# Patient Record
Sex: Male | Born: 1965 | Race: Black or African American | Hispanic: No | Marital: Married
Health system: Southern US, Community
[De-identification: ages and names within clinical notes are randomized; demographics above are authoritative.]

---

## 2005-10-14 ENCOUNTER — Emergency Department: Payer: Self-pay | Admitting: Emergency Medicine

## 2006-04-19 ENCOUNTER — Emergency Department: Payer: Self-pay | Admitting: Emergency Medicine

## 2006-04-21 ENCOUNTER — Emergency Department: Payer: Self-pay | Admitting: Emergency Medicine

## 2006-09-14 ENCOUNTER — Emergency Department (HOSPITAL_COMMUNITY): Admission: EM | Admit: 2006-09-14 | Discharge: 2006-09-14 | Payer: Self-pay | Admitting: Emergency Medicine

## 2007-04-15 ENCOUNTER — Emergency Department: Payer: Self-pay | Admitting: Emergency Medicine

## 2007-11-11 ENCOUNTER — Emergency Department: Payer: Self-pay | Admitting: Unknown Physician Specialty

## 2007-12-15 ENCOUNTER — Other Ambulatory Visit: Payer: Self-pay

## 2007-12-15 ENCOUNTER — Emergency Department: Payer: Self-pay | Admitting: Unknown Physician Specialty

## 2008-02-17 ENCOUNTER — Emergency Department: Payer: Self-pay | Admitting: Emergency Medicine

## 2008-02-17 ENCOUNTER — Other Ambulatory Visit: Payer: Self-pay

## 2008-07-01 ENCOUNTER — Emergency Department: Payer: Self-pay | Admitting: Emergency Medicine

## 2009-06-19 ENCOUNTER — Emergency Department: Payer: Self-pay | Admitting: Emergency Medicine

## 2010-12-19 ENCOUNTER — Emergency Department: Payer: Self-pay | Admitting: Emergency Medicine

## 2010-12-21 ENCOUNTER — Emergency Department: Payer: Self-pay | Admitting: Emergency Medicine

## 2011-03-16 ENCOUNTER — Emergency Department: Payer: Self-pay | Admitting: Emergency Medicine

## 2011-03-19 ENCOUNTER — Emergency Department: Payer: Self-pay | Admitting: Emergency Medicine

## 2011-07-19 ENCOUNTER — Emergency Department: Payer: Self-pay | Admitting: Emergency Medicine

## 2011-08-17 ENCOUNTER — Emergency Department: Payer: Self-pay | Admitting: *Deleted
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
## 2012-02-08 ENCOUNTER — Emergency Department: Payer: Self-pay | Admitting: Emergency Medicine

## 2012-06-05 ENCOUNTER — Emergency Department: Payer: Self-pay | Admitting: Emergency Medicine

## 2014-09-12 ENCOUNTER — Encounter (HOSPITAL_COMMUNITY): Payer: Self-pay | Admitting: Emergency Medicine

## 2014-09-12 ENCOUNTER — Emergency Department (HOSPITAL_COMMUNITY): Payer: Self-pay

## 2014-09-12 ENCOUNTER — Emergency Department (HOSPITAL_COMMUNITY)
Admission: EM | Admit: 2014-09-12 | Discharge: 2014-09-12 | Disposition: A | Discharge status: Home / Self Care | Payer: Self-pay | Attending: Emergency Medicine | Admitting: Emergency Medicine

## 2014-09-12 DIAGNOSIS — S41111A Laceration without foreign body of right upper arm, initial encounter: Secondary | ICD-10-CM

## 2014-09-12 DIAGNOSIS — S41109A Unspecified open wound of unspecified upper arm, initial encounter: Secondary | ICD-10-CM | POA: Insufficient documentation

## 2014-09-12 DIAGNOSIS — Z23 Encounter for immunization: Secondary | ICD-10-CM | POA: Insufficient documentation

## 2014-09-12 DIAGNOSIS — S41122A Laceration with foreign body of left upper arm, initial encounter: Secondary | ICD-10-CM

## 2014-09-12 DIAGNOSIS — T700XXA Otitic barotrauma, initial encounter: Secondary | ICD-10-CM | POA: Insufficient documentation

## 2014-09-12 DIAGNOSIS — Y92410 Unspecified street and highway as the place of occurrence of the external cause: Secondary | ICD-10-CM | POA: Insufficient documentation

## 2014-09-12 DIAGNOSIS — Y9389 Activity, other specified: Secondary | ICD-10-CM | POA: Insufficient documentation

## 2014-09-12 DIAGNOSIS — S61412A Laceration without foreign body of left hand, initial encounter: Secondary | ICD-10-CM

## 2014-09-12 DIAGNOSIS — S51809A Unspecified open wound of unspecified forearm, initial encounter: Secondary | ICD-10-CM | POA: Insufficient documentation

## 2014-09-12 MED ORDER — OXYCODONE-ACETAMINOPHEN 5-325 MG PO TABS
2.0000 | ORAL_TABLET | Freq: Once | ORAL | Status: AC
  Administered 2014-09-12: 2 via ORAL
  Filled 2014-09-12: qty 2

## 2014-09-12 MED ORDER — LIDOCAINE-EPINEPHRINE 1 %-1:100000 IJ SOLN: 30.0000 mL | Freq: Once | INTRAMUSCULAR | Status: DC

## 2014-09-12 MED ORDER — LIDOCAINE HCL 2 % IJ SOLN
10.0000 mL | Freq: Once | INTRAMUSCULAR | Status: AC
  Administered 2014-09-12: 200 mg via INTRADERMAL
  Filled 2014-09-12: qty 20

## 2014-09-12 MED ORDER — HYDROMORPHONE HCL 1 MG/ML IJ SOLN
1.0000 mg | Freq: Once | INTRAMUSCULAR | Status: AC
  Administered 2014-09-12: 1 mg via INTRAMUSCULAR
  Filled 2014-09-12: qty 1

## 2014-09-12 MED ORDER — OXYCODONE-ACETAMINOPHEN 5-325 MG PO TABS: 1.0000 | ORAL_TABLET | ORAL | Status: AC | PRN

## 2014-09-12 MED ORDER — LIDOCAINE-EPINEPHRINE 1 %-1:100000 IJ SOLN
INTRAMUSCULAR | Status: AC
  Filled 2014-09-12: qty 2

## 2014-09-12 MED ORDER — CEPHALEXIN 500 MG PO CAPS: 500.0000 mg | ORAL_CAPSULE | Freq: Four times a day (QID) | ORAL | Status: AC

## 2014-09-12 MED ORDER — CEFAZOLIN (ANCEF) 1 G IV SOLR
2.0000 g | INTRAVENOUS | Status: DC
  Filled 2014-09-12: qty 2

## 2014-09-12 MED ORDER — TETANUS-DIPHTH-ACELL PERTUSSIS 5-2.5-18.5 LF-MCG/0.5 IM SUSP
0.5000 mL | Freq: Once | INTRAMUSCULAR | Status: AC
  Administered 2014-09-12: 0.5 mL via INTRAMUSCULAR
  Filled 2014-09-12: qty 0.5

## 2014-09-12 MED ORDER — HYDROMORPHONE HCL 1 MG/ML IJ SOLN: 1.0000 mg | INTRAMUSCULAR | Status: DC

## 2014-09-12 MED ORDER — CEFAZOLIN SODIUM-DEXTROSE 2-3 GM-% IV SOLR
2.0000 g | Freq: Once | INTRAVENOUS | Status: AC
  Administered 2014-09-12: 2 g via INTRAVENOUS
  Filled 2014-09-12: qty 50

## 2014-09-12 MED ORDER — METHOCARBAMOL 750 MG PO TABS: 750.0000 mg | ORAL_TABLET | Freq: Two times a day (BID) | ORAL | Status: AC

## 2014-09-12 MED ORDER — LIDOCAINE-EPINEPHRINE 1 %-1:100000 IJ SOLN
INTRAMUSCULAR | Status: AC
  Filled 2014-09-12: qty 1
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
## 2014-09-14 NOTE — ED Provider Notes (Signed)
Medical screening examination/treatment/procedure(s) were performed by non-physician practitioner and as supervising physician I was immediately available for consultation/collaboration.   EKG Interpretation None        Adianna Darwin N Murial Beam, DO 09/14/14 1711
